# Patient Record
Sex: Female | Born: 1960 | Race: White | Hispanic: No | Marital: Married | Smoking: Never smoker
Health system: Southern US, Community
[De-identification: ages and names within clinical notes are randomized; demographics above are authoritative.]

## PROBLEM LIST (undated history)

## (undated) DIAGNOSIS — K589 Irritable bowel syndrome without diarrhea: Secondary | ICD-10-CM

## (undated) DIAGNOSIS — C539 Malignant neoplasm of cervix uteri, unspecified: Secondary | ICD-10-CM

## (undated) DIAGNOSIS — F32A Depression, unspecified: Secondary | ICD-10-CM

## (undated) DIAGNOSIS — F419 Anxiety disorder, unspecified: Secondary | ICD-10-CM

## (undated) DIAGNOSIS — G43909 Migraine, unspecified, not intractable, without status migrainosus: Secondary | ICD-10-CM

## (undated) DIAGNOSIS — R011 Cardiac murmur, unspecified: Secondary | ICD-10-CM

## (undated) HISTORY — DX: Anxiety disorder, unspecified: F41.9

## (undated) HISTORY — DX: Migraine, unspecified, not intractable, without status migrainosus: G43.909

## (undated) HISTORY — DX: Depression, unspecified: F32.A

## (undated) HISTORY — DX: Irritable bowel syndrome without diarrhea: K58.9

## (undated) HISTORY — DX: Cardiac murmur, unspecified: R01.1

## (undated) HISTORY — PX: ABDOMINAL HYSTERECTOMY: SUR658

## (undated) HISTORY — DX: Malignant neoplasm of cervix uteri, unspecified: C53.9

---

## 1996-10-20 HISTORY — PX: HAND TENDON SURGERY: SHX663

## 2006-04-02 ENCOUNTER — Ambulatory Visit (HOSPITAL_COMMUNITY): Admission: RE | Admit: 2006-04-02 | Discharge: 2006-04-02 | Payer: Self-pay | Admitting: Obstetrics and Gynecology

## 2006-07-29 ENCOUNTER — Emergency Department (HOSPITAL_COMMUNITY): Admission: EM | Admit: 2006-07-29 | Discharge: 2006-07-29 | Payer: Self-pay | Admitting: Emergency Medicine

## 2006-08-13 ENCOUNTER — Encounter (HOSPITAL_COMMUNITY): Admission: RE | Admit: 2006-08-13 | Discharge: 2006-09-12 | Payer: Self-pay | Admitting: Occupational Medicine

## 2006-09-14 ENCOUNTER — Encounter (HOSPITAL_COMMUNITY): Admission: RE | Admit: 2006-09-14 | Discharge: 2006-10-14 | Payer: Self-pay | Admitting: Occupational Medicine

## 2006-10-05 ENCOUNTER — Emergency Department (HOSPITAL_COMMUNITY): Admission: EM | Admit: 2006-10-05 | Discharge: 2006-10-06 | Payer: Self-pay | Admitting: Emergency Medicine

## 2008-03-17 ENCOUNTER — Ambulatory Visit (HOSPITAL_COMMUNITY): Admission: RE | Admit: 2008-03-17 | Discharge: 2008-03-17 | Payer: Self-pay | Admitting: Obstetrics and Gynecology

## 2008-04-03 ENCOUNTER — Encounter: Admission: RE | Admit: 2008-04-03 | Discharge: 2008-04-03 | Payer: Self-pay | Admitting: Obstetrics and Gynecology

## 2010-01-25 ENCOUNTER — Encounter: Admission: RE | Admit: 2010-01-25 | Discharge: 2010-01-25 | Payer: Self-pay | Admitting: Obstetrics and Gynecology

## 2010-11-10 ENCOUNTER — Encounter: Payer: Self-pay | Admitting: Obstetrics and Gynecology

## 2010-12-20 ENCOUNTER — Ambulatory Visit (INDEPENDENT_AMBULATORY_CARE_PROVIDER_SITE_OTHER): Payer: 59

## 2010-12-20 ENCOUNTER — Inpatient Hospital Stay (INDEPENDENT_AMBULATORY_CARE_PROVIDER_SITE_OTHER)
Admission: RE | Admit: 2010-12-20 | Discharge: 2010-12-20 | Disposition: A | Discharge status: Home / Self Care | Payer: 59 | Source: Ambulatory Visit | Attending: Family Medicine | Admitting: Family Medicine

## 2010-12-20 DIAGNOSIS — M549 Dorsalgia, unspecified: Secondary | ICD-10-CM

## 2010-12-20 DIAGNOSIS — K589 Irritable bowel syndrome without diarrhea: Secondary | ICD-10-CM

## 2012-03-01 ENCOUNTER — Encounter: Payer: Self-pay | Admitting: Gastroenterology

## 2012-03-18 ENCOUNTER — Ambulatory Visit (INDEPENDENT_AMBULATORY_CARE_PROVIDER_SITE_OTHER): Payer: 59 | Admitting: Gastroenterology

## 2012-03-18 ENCOUNTER — Encounter: Payer: Self-pay | Admitting: Gastroenterology

## 2012-03-18 VITALS — BP 100/60 | HR 64 | Ht 63.5 in | Wt 136.6 lb

## 2012-03-18 DIAGNOSIS — R143 Flatulence: Secondary | ICD-10-CM

## 2012-03-18 DIAGNOSIS — R140 Abdominal distension (gaseous): Secondary | ICD-10-CM

## 2012-03-18 DIAGNOSIS — K5900 Constipation, unspecified: Secondary | ICD-10-CM

## 2012-03-18 DIAGNOSIS — K589 Irritable bowel syndrome without diarrhea: Secondary | ICD-10-CM

## 2012-03-18 MED ORDER — POLYETHYLENE GLYCOL 3350 17 GM/SCOOP PO POWD: 17.0000 g | Freq: Every day | ORAL | Status: AC

## 2012-03-18 MED ORDER — HYOSCYAMINE SULFATE 0.125 MG PO TABS: ORAL_TABLET | ORAL | Status: DC

## 2012-03-18 NOTE — Patient Instructions (Addendum)
Increase taking your hyoscyamine to 1-2 tablets by mouth every 4 hours as needed. A prescription has been sent to your pharmacy.  Take Miralax mixing 17 grams in 8 oz of water every day for constipation. cc:  Arlina Robes, MD

## 2012-03-18 NOTE — Progress Notes (Signed)
History of Present Illness: This is a 51 year old female with a long history of irritable bowel syndrome. She has constipation with abdominal bloating and left lower quadrant pain. Occasionally she has diarrhea. She states she underwent colonoscopy in [place] in 2002 and she reports that examination was negative. She notes that stress tends to make her pain and constipation much worse. She has recently been increasing her dietary fiber and her symptoms have improved. She has a bowel movement about every 1 to 3 days. Denies weight loss, diarrhea, change in stool caliber, melena, hematochezia, nausea, vomiting, dysphagia, reflux symptoms, chest pain.  Allergies  Allergen Reactions  . Augmentin (Amoxicillin-Pot Clavulanate) Diarrhea and Nausea And Vomiting  . Codeine Diarrhea and Nausea And Vomiting  . Erythromycin Diarrhea and Nausea And Vomiting   Outpatient Prescriptions Prior to Visit  Medication Sig Dispense Refill  . hyoscyamine (LEVSIN, ANASPAZ) 0.125 MG tablet Take 0.125 mg by mouth 3 (three) times daily as needed.      . Phentermine HCl 37.5 MG TBDP Take 1 tablet by mouth daily.      . verapamil (VERELAN PM) 240 MG 24 hr capsule Take 240 mg by mouth at bedtime.       Past Medical History  Diagnosis Date  . IBS (irritable bowel syndrome)   . Migraines   . Anxiety   . Cervical cancer 003   Past Surgical History  Procedure Date  . Abdominal hysterectomy   . Cesarean section    History   Social History  . Marital Status: Married    Spouse Name: N/A    Number of Children: 1  . Years of Education: N/A   Occupational History  . RN [employer]   Social History Main Topics  . Smoking status: Never Smoker   . Smokeless tobacco: Never Used  . Alcohol Use: No  . Drug Use: No  . Sexually Active: None   Other Topics Concern  . None   Social History Narrative  . None   Family History  Problem Relation Age of Onset  . Prostate cancer Father   . Colon polyps Father    . Diabetes Maternal Grandfather   . Breast cancer Paternal Grandmother   . Colon cancer Paternal Grandmother   . Heart disease Paternal Grandfather   . Hypertension Mother     Review of Systems: Pertinent positive and negative review of systems were noted in the above HPI section. All other review of systems were otherwise negative.  Physical Exam: General: Well developed , well nourished, no acute distress Head: Normocephalic and atraumatic Eyes:  sclerae anicteric, EOMI Ears: Normal auditory acuity Mouth: No deformity or lesions Neck: Supple, no masses or thyromegaly Lungs: Clear throughout to auscultation Heart: Regular rate and rhythm; no murmurs, rubs or bruits Abdomen: Soft, non tender and non distended. No masses, hepatosplenomegaly or hernias noted. Normal Bowel sounds Rectal: Deferred to colonoscopy Musculoskeletal: Symmetrical with no gross deformities  Skin: No lesions on visible extremities Pulses:  Normal pulses noted Extremities: No clubbing, cyanosis, edema or deformities noted Neurological: Alert oriented x 4, grossly nonfocal Cervical Nodes:  No significant cervical adenopathy Inguinal Nodes: No significant inguinal adenopathy Psychological:  Alert and cooperative. Normal mood and affect  Assessment and Recommendations:  1. Left lower quadrant pain, constipation, abdominal bloating are all likely secondary to chronic irritable bowel syndrome. Continue a high fiber diet and daily probiotics. Begin MiraLax once daily. Increase hyoscyamine to 1-2 every 4 hours as needed. I recommended proceeding with colonoscopy  to rule out colorectal lesions and for colorectal cancer screening. The risks, benefits, and alternatives to colonoscopy with possible biopsy and possible polypectomy were discussed with the patient and they consent to proceed but she would like to temporarily postpone her exam until she institutes all  above recommendations.

## 2012-05-05 ENCOUNTER — Encounter: Payer: Self-pay | Admitting: Gastroenterology

## 2012-05-05 ENCOUNTER — Ambulatory Visit (INDEPENDENT_AMBULATORY_CARE_PROVIDER_SITE_OTHER): Payer: 59 | Admitting: Gastroenterology

## 2012-05-05 VITALS — BP 100/72 | HR 64 | Ht 63.5 in | Wt 136.6 lb

## 2012-05-05 DIAGNOSIS — K589 Irritable bowel syndrome without diarrhea: Secondary | ICD-10-CM

## 2012-05-05 DIAGNOSIS — K5900 Constipation, unspecified: Secondary | ICD-10-CM

## 2012-05-05 MED ORDER — MOVIPREP 100 G PO SOLR: 1.0000 | Freq: Once | ORAL | Status: DC

## 2012-05-05 NOTE — Patient Instructions (Addendum)
You have been scheduled for a colonoscopy with propofol. Please follow written instructions given to you at your visit today.  Please pick up your prep kit at the pharmacy within the next 1-3 days. If you use inhalers (even only as needed), please bring them with you on the day of your procedure. Take Gas-X four times a day as needed for gas, bloating. cc: Ardean Larsen, MD

## 2012-05-05 NOTE — Progress Notes (Signed)
History of Present Illness: This is a 51 year old female with ongoing constipation, occasional diarrhea, left lower quadrant abdominal pain and bloating. She states she has just started MiraLax and has had noted slightly looser stools and less constipation but called her symptoms persist. She is ready to proceed with colonoscopy.  Current Medications, Allergies, Past Medical History, Past Surgical History, Family History and Social History were reviewed in [employer] record.  Physical Exam: General: Well developed , well nourished, no acute distress Head: Normocephalic and atraumatic Eyes:  sclerae anicteric, EOMI Ears: Normal auditory acuity Mouth: No deformity or lesions Lungs: Clear throughout to auscultation Heart: Regular rate and rhythm; no murmurs, rubs or bruits Abdomen: Soft, mild, generalized tenderness to deep palpation without rebound or guarding and non distended. No masses, hepatosplenomegaly or hernias noted. Normal Bowel sounds Musculoskeletal: Symmetrical with no gross deformities  Pulses:  Normal pulses noted Extremities: No clubbing, cyanosis, edema or deformities noted Neurological: Alert oriented x 4, grossly nonfocal Psychological:  Alert and cooperative. Normal mood and affect  Assessment and Recommendations:  1. Suspected chronic irritable bowel syndrome. Rule out colorectal neoplasms and other disorders. Continue a high fiber diet and daily probiotics. MiraLax qod to bid titrated to need. Continue hyoscyamine to 1-2 every 4 hours as needed. Begin Gas-X 4 times a day when necessary. I recommended proceeding with colonoscopy to rule out colorectal lesions and for colorectal cancer screening. The risks, benefits, and alternatives to colonoscopy with possible biopsy and possible polypectomy were discussed with the patient and they consent to proceed.

## 2012-06-15 ENCOUNTER — Encounter: Payer: Self-pay | Admitting: Gastroenterology

## 2012-06-15 ENCOUNTER — Ambulatory Visit (AMBULATORY_SURGERY_CENTER): Payer: 59 | Admitting: Gastroenterology

## 2012-06-15 VITALS — BP 115/63 | HR 67 | Temp 98.2°F | Resp 12 | Ht 63.0 in | Wt 136.0 lb

## 2012-06-15 DIAGNOSIS — K589 Irritable bowel syndrome without diarrhea: Secondary | ICD-10-CM

## 2012-06-15 DIAGNOSIS — Z1211 Encounter for screening for malignant neoplasm of colon: Secondary | ICD-10-CM

## 2012-06-15 DIAGNOSIS — K5900 Constipation, unspecified: Secondary | ICD-10-CM

## 2012-06-15 MED ORDER — SODIUM CHLORIDE 0.9 % IV SOLN: 500.0000 mL | INTRAVENOUS | Status: DC

## 2012-06-15 NOTE — Progress Notes (Signed)
Patient did not experience any of the following events: a burn prior to discharge; a fall within the facility; wrong site/side/patient/procedure/implant event; or a hospital transfer or hospital admission upon discharge from the facility. (G8907) Patient did not have preoperative order for IV antibiotic SSI prophylaxis. (G8918)  

## 2012-06-15 NOTE — Progress Notes (Signed)
PATIENT TO BATHROOM PRIOR TO DISCHARGE. 

## 2012-06-15 NOTE — Op Note (Signed)
[facility] [address] [place], [address]   COLONOSCOPY PROCEDURE REPORT  PATIENT: Brittany Stewart, Brittany Stewart  MR#: [id number] BIRTHDATE: April 25, 1961 , 51  yrs. old GENDER: Female ENDOSCOPIST: Meryl Dare, MD, [facility]  PROCEDURE DATE:  06/15/2012 PROCEDURE:   Colonoscopy, screening ASA CLASS:   Class II INDICATIONS:average risk patient for colorectal cancer MEDICATIONS: MAC sedation, administered by CRNA and propofol (Diprivan) 300mg  IV DESCRIPTION OF PROCEDURE:   After the risks benefits and alternatives of the procedure were thoroughly explained, informed consent was obtained.  A digital rectal exam revealed no abnormalities of the rectum.   The LB [id number]  endoscope was introduced through the anus and advanced to the cecum, which was identified by both the appendix and ileocecal valve. No adverse events experienced.   She had a tortuous and redundant colon.   The quality of the prep was good, using MoviPrep  The instrument was then slowly withdrawn as the colon was fully examined.   COLON FINDINGS: A normal appearing cecum, ileocecal valve, and appendiceal orifice were identified.  The ascending, hepatic flexure, transverse, splenic flexure, descending, sigmoid colon and rectum appeared unremarkable.  No polyps or cancers were seen. Retroflexed views revealed small internal hemorrhoids. The time to cecum=2 minutes 15 seconds.  Withdrawal time=13 minutes 30 seconds. The scope was withdrawn and the procedure completed.  COMPLICATIONS: There were no complications.  ENDOSCOPIC IMPRESSION: 1. Normal colon  RECOMMENDATIONS: 1. Continue current colorectal screening recommendations for "routine risk" patients with a repeat colonoscopy in 10 years.   eSigned:  Meryl Dare, MD, [facility] 06/15/2012 2:59 PM   cc: Arlina Robes, MD

## 2012-06-15 NOTE — Patient Instructions (Signed)
YOU HAD AN ENDOSCOPIC PROCEDURE TODAY AT THE [facility]: Refer to the procedure report that was given to you for any specific questions about what was found during the examination.  If the procedure report does not answer your questions, please call your gastroenterologist to clarify.  If you requested that your care partner not be given the details of your procedure findings, then the procedure report has been included in a sealed envelope for you to review at your convenience later.  YOU SHOULD EXPECT: Some feelings of bloating in the abdomen. Passage of more gas than usual.  Walking can help get rid of the air that was put into your GI tract during the procedure and reduce the bloating. If you had a lower endoscopy (such as a colonoscopy or flexible sigmoidoscopy) you may notice spotting of blood in your stool or on the toilet paper. If you underwent a bowel prep for your procedure, then you may not have a normal bowel movement for a few days.  DIET: Your first meal following the procedure should be a light meal and then it is ok to progress to your normal diet.  A half-sandwich or bowl of soup is an example of a good first meal.  Heavy or fried foods are harder to digest and may make you feel nauseous or bloated.  Likewise meals heavy in dairy and vegetables can cause extra gas to form and this can also increase the bloating.  Drink plenty of fluids but you should avoid alcoholic beverages for 24 hours.  ACTIVITY: Your care partner should take you home directly after the procedure.  You should plan to take it easy, moving slowly for the rest of the day.  You can resume normal activity the day after the procedure however you should NOT DRIVE or use heavy machinery for 24 hours (because of the sedation medicines used during the test).    SYMPTOMS TO REPORT IMMEDIATELY: A gastroenterologist can be reached at any hour.  During normal business hours, 8:30 AM to 5:00 PM Monday through Friday,  call [phone].  After hours and on weekends, please call the GI answering service at [phone] who will take a message and have the physician on call contact you.   Following lower endoscopy (colonoscopy or flexible sigmoidoscopy):  Excessive amounts of blood in the stool  Significant tenderness or worsening of abdominal pains  Swelling of the abdomen that is new, acute  Fever of 100F or higher    FOLLOW UP: If any biopsies were taken you will be contacted by phone or by letter within the next 1-3 weeks.  Call your gastroenterologist if you have not heard about the biopsies in 3 weeks.  Our staff will call the home number listed on your records the next business day following your procedure to check on you and address any questions or concerns that you may have at that time regarding the information given to you following your procedure. This is a courtesy call and so if there is no answer at the home number and we have not heard from you through the emergency physician on call, we will assume that you have returned to your regular daily activities without incident.  SIGNATURES/CONFIDENTIALITY: You and/or your care partner have signed paperwork which will be entered into your electronic medical record.  These signatures attest to the fact that that the information above on your After Visit Summary has been reviewed and is understood.  Full responsibility of the confidentiality   of this discharge information lies with you and/or your care-partner.     

## 2012-06-16 ENCOUNTER — Telehealth: Payer: Self-pay | Admitting: *Deleted

## 2012-06-16 NOTE — Telephone Encounter (Signed)
Telephone number provided reached a voice mail for a Brittany Stewart, no message left.

## 2013-04-06 ENCOUNTER — Other Ambulatory Visit: Payer: Self-pay

## 2013-04-06 DIAGNOSIS — Z1231 Encounter for screening mammogram for malignant neoplasm of breast: Secondary | ICD-10-CM

## 2013-04-18 ENCOUNTER — Ambulatory Visit
Admission: RE | Admit: 2013-04-18 | Discharge: 2013-04-18 | Disposition: A | Discharge status: Home / Self Care | Payer: 59 | Source: Ambulatory Visit

## 2013-04-18 DIAGNOSIS — Z1231 Encounter for screening mammogram for malignant neoplasm of breast: Secondary | ICD-10-CM

## 2014-03-07 ENCOUNTER — Other Ambulatory Visit (HOSPITAL_COMMUNITY): Payer: Self-pay | Admitting: Family Medicine

## 2014-03-07 DIAGNOSIS — M545 Low back pain, unspecified: Secondary | ICD-10-CM

## 2014-03-07 DIAGNOSIS — M5410 Radiculopathy, site unspecified: Secondary | ICD-10-CM

## 2014-03-09 ENCOUNTER — Ambulatory Visit (HOSPITAL_COMMUNITY)
Admission: RE | Admit: 2014-03-09 | Discharge: 2014-03-09 | Disposition: A | Discharge status: Home / Self Care | Payer: 59 | Source: Ambulatory Visit | Attending: Family Medicine | Admitting: Family Medicine

## 2014-03-09 DIAGNOSIS — M5126 Other intervertebral disc displacement, lumbar region: Secondary | ICD-10-CM | POA: Diagnosis not present

## 2014-03-09 DIAGNOSIS — M545 Low back pain, unspecified: Secondary | ICD-10-CM | POA: Insufficient documentation

## 2014-03-09 DIAGNOSIS — M5410 Radiculopathy, site unspecified: Secondary | ICD-10-CM

## 2014-03-16 ENCOUNTER — Other Ambulatory Visit: Payer: Self-pay | Admitting: Neurological Surgery

## 2014-03-16 DIAGNOSIS — M5416 Radiculopathy, lumbar region: Secondary | ICD-10-CM

## 2014-03-24 ENCOUNTER — Ambulatory Visit
Admission: RE | Admit: 2014-03-24 | Discharge: 2014-03-24 | Disposition: A | Discharge status: Home / Self Care | Payer: 59 | Source: Ambulatory Visit | Attending: Neurological Surgery | Admitting: Neurological Surgery

## 2014-03-24 DIAGNOSIS — M5416 Radiculopathy, lumbar region: Secondary | ICD-10-CM

## 2014-03-24 MED ORDER — IOHEXOL 180 MG/ML  SOLN
1.0000 mL | Freq: Once | INTRAMUSCULAR | Status: AC | PRN
  Administered 2014-03-24: 1 mL via EPIDURAL

## 2014-03-24 MED ORDER — METHYLPREDNISOLONE ACETATE 40 MG/ML INJ SUSP (RADIOLOG
120.0000 mg | Freq: Once | INTRAMUSCULAR | Status: AC
  Administered 2014-03-24: 120 mg via EPIDURAL

## 2014-03-24 NOTE — Discharge Instructions (Signed)

## 2014-04-04 ENCOUNTER — Other Ambulatory Visit: Payer: Self-pay | Admitting: Neurological Surgery

## 2014-04-04 DIAGNOSIS — M545 Low back pain, unspecified: Secondary | ICD-10-CM

## 2014-04-04 DIAGNOSIS — M5430 Sciatica, unspecified side: Secondary | ICD-10-CM

## 2014-04-07 ENCOUNTER — Ambulatory Visit
Admission: RE | Admit: 2014-04-07 | Discharge: 2014-04-07 | Disposition: A | Discharge status: Home / Self Care | Payer: 59 | Source: Ambulatory Visit | Attending: Neurological Surgery | Admitting: Neurological Surgery

## 2014-04-07 DIAGNOSIS — M545 Low back pain, unspecified: Secondary | ICD-10-CM

## 2014-04-07 DIAGNOSIS — M5430 Sciatica, unspecified side: Secondary | ICD-10-CM

## 2014-04-07 MED ORDER — METHYLPREDNISOLONE ACETATE 40 MG/ML INJ SUSP (RADIOLOG
120.0000 mg | Freq: Once | INTRAMUSCULAR | Status: AC
  Administered 2014-04-07: 120 mg via EPIDURAL

## 2014-04-07 MED ORDER — IOHEXOL 180 MG/ML  SOLN
1.0000 mL | Freq: Once | INTRAMUSCULAR | Status: AC | PRN
  Administered 2014-04-07: 1 mL via EPIDURAL

## 2014-04-07 NOTE — Discharge Instructions (Signed)

## 2015-04-18 ENCOUNTER — Other Ambulatory Visit (HOSPITAL_COMMUNITY): Payer: Self-pay | Admitting: Obstetrics and Gynecology

## 2015-04-18 DIAGNOSIS — Z1231 Encounter for screening mammogram for malignant neoplasm of breast: Secondary | ICD-10-CM

## 2015-04-20 ENCOUNTER — Ambulatory Visit (HOSPITAL_COMMUNITY)
Admission: RE | Admit: 2015-04-20 | Discharge: 2015-04-20 | Disposition: A | Discharge status: Home / Self Care | Payer: 59 | Source: Ambulatory Visit | Attending: Obstetrics and Gynecology | Admitting: Obstetrics and Gynecology

## 2015-04-20 DIAGNOSIS — Z1231 Encounter for screening mammogram for malignant neoplasm of breast: Secondary | ICD-10-CM

## 2015-09-10 ENCOUNTER — Other Ambulatory Visit (HOSPITAL_COMMUNITY): Payer: Self-pay | Admitting: Chiropractic Medicine

## 2015-09-10 ENCOUNTER — Other Ambulatory Visit: Payer: Self-pay | Admitting: Chiropractic Medicine

## 2015-09-10 DIAGNOSIS — M5117 Intervertebral disc disorders with radiculopathy, lumbosacral region: Secondary | ICD-10-CM

## 2015-09-11 ENCOUNTER — Ambulatory Visit (HOSPITAL_COMMUNITY)
Admission: RE | Admit: 2015-09-11 | Discharge: 2015-09-11 | Disposition: A | Discharge status: Home / Self Care | Payer: 59 | Source: Ambulatory Visit | Attending: Chiropractic Medicine | Admitting: Chiropractic Medicine

## 2015-09-11 DIAGNOSIS — M79605 Pain in left leg: Secondary | ICD-10-CM | POA: Insufficient documentation

## 2015-09-11 DIAGNOSIS — M5126 Other intervertebral disc displacement, lumbar region: Secondary | ICD-10-CM | POA: Diagnosis not present

## 2015-09-11 DIAGNOSIS — M5117 Intervertebral disc disorders with radiculopathy, lumbosacral region: Secondary | ICD-10-CM

## 2015-09-11 DIAGNOSIS — R200 Anesthesia of skin: Secondary | ICD-10-CM | POA: Insufficient documentation

## 2015-10-23 DIAGNOSIS — M9903 Segmental and somatic dysfunction of lumbar region: Secondary | ICD-10-CM | POA: Diagnosis not present

## 2015-10-23 DIAGNOSIS — M9904 Segmental and somatic dysfunction of sacral region: Secondary | ICD-10-CM | POA: Diagnosis not present

## 2015-10-23 DIAGNOSIS — M5117 Intervertebral disc disorders with radiculopathy, lumbosacral region: Secondary | ICD-10-CM | POA: Diagnosis not present

## 2015-10-23 DIAGNOSIS — M9905 Segmental and somatic dysfunction of pelvic region: Secondary | ICD-10-CM | POA: Diagnosis not present

## 2015-10-24 DIAGNOSIS — M9903 Segmental and somatic dysfunction of lumbar region: Secondary | ICD-10-CM | POA: Diagnosis not present

## 2015-10-24 DIAGNOSIS — M9904 Segmental and somatic dysfunction of sacral region: Secondary | ICD-10-CM | POA: Diagnosis not present

## 2015-10-24 DIAGNOSIS — M9905 Segmental and somatic dysfunction of pelvic region: Secondary | ICD-10-CM | POA: Diagnosis not present

## 2015-10-24 DIAGNOSIS — M5117 Intervertebral disc disorders with radiculopathy, lumbosacral region: Secondary | ICD-10-CM | POA: Diagnosis not present

## 2015-10-25 ENCOUNTER — Other Ambulatory Visit: Payer: Self-pay | Admitting: Chiropractic Medicine

## 2015-10-25 DIAGNOSIS — M5117 Intervertebral disc disorders with radiculopathy, lumbosacral region: Secondary | ICD-10-CM | POA: Diagnosis not present

## 2015-10-25 DIAGNOSIS — M9904 Segmental and somatic dysfunction of sacral region: Secondary | ICD-10-CM | POA: Diagnosis not present

## 2015-10-25 DIAGNOSIS — R200 Anesthesia of skin: Secondary | ICD-10-CM

## 2015-10-25 DIAGNOSIS — M545 Low back pain: Principal | ICD-10-CM

## 2015-10-25 DIAGNOSIS — M9905 Segmental and somatic dysfunction of pelvic region: Secondary | ICD-10-CM | POA: Diagnosis not present

## 2015-10-25 DIAGNOSIS — G8929 Other chronic pain: Secondary | ICD-10-CM

## 2015-10-25 DIAGNOSIS — M9903 Segmental and somatic dysfunction of lumbar region: Secondary | ICD-10-CM | POA: Diagnosis not present

## 2015-10-29 DIAGNOSIS — M9903 Segmental and somatic dysfunction of lumbar region: Secondary | ICD-10-CM | POA: Diagnosis not present

## 2015-10-29 DIAGNOSIS — M9904 Segmental and somatic dysfunction of sacral region: Secondary | ICD-10-CM | POA: Diagnosis not present

## 2015-10-29 DIAGNOSIS — M5117 Intervertebral disc disorders with radiculopathy, lumbosacral region: Secondary | ICD-10-CM | POA: Diagnosis not present

## 2015-10-29 DIAGNOSIS — M9905 Segmental and somatic dysfunction of pelvic region: Secondary | ICD-10-CM | POA: Diagnosis not present

## 2015-10-31 DIAGNOSIS — M9903 Segmental and somatic dysfunction of lumbar region: Secondary | ICD-10-CM | POA: Diagnosis not present

## 2015-10-31 DIAGNOSIS — M5117 Intervertebral disc disorders with radiculopathy, lumbosacral region: Secondary | ICD-10-CM | POA: Diagnosis not present

## 2015-10-31 DIAGNOSIS — M9905 Segmental and somatic dysfunction of pelvic region: Secondary | ICD-10-CM | POA: Diagnosis not present

## 2015-10-31 DIAGNOSIS — M9904 Segmental and somatic dysfunction of sacral region: Secondary | ICD-10-CM | POA: Diagnosis not present

## 2015-11-01 ENCOUNTER — Ambulatory Visit
Admission: RE | Admit: 2015-11-01 | Discharge: 2015-11-01 | Disposition: A | Discharge status: Home / Self Care | Payer: 59 | Source: Ambulatory Visit | Attending: Chiropractic Medicine | Admitting: Chiropractic Medicine

## 2015-11-01 DIAGNOSIS — G8929 Other chronic pain: Secondary | ICD-10-CM

## 2015-11-01 DIAGNOSIS — M47817 Spondylosis without myelopathy or radiculopathy, lumbosacral region: Secondary | ICD-10-CM | POA: Diagnosis not present

## 2015-11-01 DIAGNOSIS — M545 Low back pain: Principal | ICD-10-CM

## 2015-11-01 MED ORDER — IOHEXOL 180 MG/ML  SOLN
1.0000 mL | Freq: Once | INTRAMUSCULAR | Status: AC | PRN
Start: 2015-11-01 — End: 2015-11-01
  Administered 2015-11-01: 1 mL via EPIDURAL

## 2015-11-01 MED ORDER — METHYLPREDNISOLONE ACETATE 40 MG/ML INJ SUSP (RADIOLOG
120.0000 mg | Freq: Once | INTRAMUSCULAR | Status: AC
  Administered 2015-11-01: 120 mg via EPIDURAL

## 2015-11-01 NOTE — Discharge Instructions (Signed)

## 2015-11-05 DIAGNOSIS — M9905 Segmental and somatic dysfunction of pelvic region: Secondary | ICD-10-CM | POA: Diagnosis not present

## 2015-11-05 DIAGNOSIS — M9903 Segmental and somatic dysfunction of lumbar region: Secondary | ICD-10-CM | POA: Diagnosis not present

## 2015-11-05 DIAGNOSIS — M5117 Intervertebral disc disorders with radiculopathy, lumbosacral region: Secondary | ICD-10-CM | POA: Diagnosis not present

## 2015-11-05 DIAGNOSIS — M9904 Segmental and somatic dysfunction of sacral region: Secondary | ICD-10-CM | POA: Diagnosis not present

## 2015-11-07 DIAGNOSIS — M9903 Segmental and somatic dysfunction of lumbar region: Secondary | ICD-10-CM | POA: Diagnosis not present

## 2015-11-07 DIAGNOSIS — M9904 Segmental and somatic dysfunction of sacral region: Secondary | ICD-10-CM | POA: Diagnosis not present

## 2015-11-07 DIAGNOSIS — M9905 Segmental and somatic dysfunction of pelvic region: Secondary | ICD-10-CM | POA: Diagnosis not present

## 2015-11-07 DIAGNOSIS — M5117 Intervertebral disc disorders with radiculopathy, lumbosacral region: Secondary | ICD-10-CM | POA: Diagnosis not present

## 2015-11-08 ENCOUNTER — Other Ambulatory Visit: Payer: Self-pay | Admitting: Chiropractic Medicine

## 2015-11-08 DIAGNOSIS — M5117 Intervertebral disc disorders with radiculopathy, lumbosacral region: Secondary | ICD-10-CM | POA: Diagnosis not present

## 2015-11-08 DIAGNOSIS — G8929 Other chronic pain: Secondary | ICD-10-CM

## 2015-11-08 DIAGNOSIS — M9903 Segmental and somatic dysfunction of lumbar region: Secondary | ICD-10-CM | POA: Diagnosis not present

## 2015-11-08 DIAGNOSIS — M545 Low back pain, unspecified: Secondary | ICD-10-CM

## 2015-11-08 DIAGNOSIS — M9904 Segmental and somatic dysfunction of sacral region: Secondary | ICD-10-CM | POA: Diagnosis not present

## 2015-11-08 DIAGNOSIS — M9905 Segmental and somatic dysfunction of pelvic region: Secondary | ICD-10-CM | POA: Diagnosis not present

## 2015-11-12 DIAGNOSIS — M9903 Segmental and somatic dysfunction of lumbar region: Secondary | ICD-10-CM | POA: Diagnosis not present

## 2015-11-12 DIAGNOSIS — M5117 Intervertebral disc disorders with radiculopathy, lumbosacral region: Secondary | ICD-10-CM | POA: Diagnosis not present

## 2015-11-12 DIAGNOSIS — M9904 Segmental and somatic dysfunction of sacral region: Secondary | ICD-10-CM | POA: Diagnosis not present

## 2015-11-12 DIAGNOSIS — M9905 Segmental and somatic dysfunction of pelvic region: Secondary | ICD-10-CM | POA: Diagnosis not present

## 2015-11-14 DIAGNOSIS — M9903 Segmental and somatic dysfunction of lumbar region: Secondary | ICD-10-CM | POA: Diagnosis not present

## 2015-11-14 DIAGNOSIS — M5117 Intervertebral disc disorders with radiculopathy, lumbosacral region: Secondary | ICD-10-CM | POA: Diagnosis not present

## 2015-11-14 DIAGNOSIS — M9905 Segmental and somatic dysfunction of pelvic region: Secondary | ICD-10-CM | POA: Diagnosis not present

## 2015-11-14 DIAGNOSIS — M9904 Segmental and somatic dysfunction of sacral region: Secondary | ICD-10-CM | POA: Diagnosis not present

## 2015-11-15 ENCOUNTER — Ambulatory Visit
Admission: RE | Admit: 2015-11-15 | Discharge: 2015-11-15 | Disposition: A | Discharge status: Home / Self Care | Payer: 59 | Source: Ambulatory Visit | Attending: Chiropractic Medicine | Admitting: Chiropractic Medicine

## 2015-11-15 DIAGNOSIS — M545 Low back pain: Principal | ICD-10-CM

## 2015-11-15 DIAGNOSIS — M47817 Spondylosis without myelopathy or radiculopathy, lumbosacral region: Secondary | ICD-10-CM | POA: Diagnosis not present

## 2015-11-15 DIAGNOSIS — G8929 Other chronic pain: Secondary | ICD-10-CM

## 2015-11-15 MED ORDER — METHYLPREDNISOLONE ACETATE 40 MG/ML INJ SUSP (RADIOLOG
120.0000 mg | Freq: Once | INTRAMUSCULAR | Status: AC
  Administered 2015-11-15: 120 mg via EPIDURAL

## 2015-11-15 MED ORDER — IOHEXOL 180 MG/ML  SOLN
1.0000 mL | Freq: Once | INTRAMUSCULAR | Status: AC | PRN
  Administered 2015-11-15: 1 mL via EPIDURAL

## 2015-11-19 DIAGNOSIS — M9903 Segmental and somatic dysfunction of lumbar region: Secondary | ICD-10-CM | POA: Diagnosis not present

## 2015-11-19 DIAGNOSIS — M9904 Segmental and somatic dysfunction of sacral region: Secondary | ICD-10-CM | POA: Diagnosis not present

## 2015-11-19 DIAGNOSIS — M5136 Other intervertebral disc degeneration, lumbar region: Secondary | ICD-10-CM | POA: Diagnosis not present

## 2015-11-19 DIAGNOSIS — M5117 Intervertebral disc disorders with radiculopathy, lumbosacral region: Secondary | ICD-10-CM | POA: Diagnosis not present

## 2015-11-19 DIAGNOSIS — M9905 Segmental and somatic dysfunction of pelvic region: Secondary | ICD-10-CM | POA: Diagnosis not present

## 2015-11-21 DIAGNOSIS — M9904 Segmental and somatic dysfunction of sacral region: Secondary | ICD-10-CM | POA: Diagnosis not present

## 2015-11-21 DIAGNOSIS — M9903 Segmental and somatic dysfunction of lumbar region: Secondary | ICD-10-CM | POA: Diagnosis not present

## 2015-11-21 DIAGNOSIS — M9905 Segmental and somatic dysfunction of pelvic region: Secondary | ICD-10-CM | POA: Diagnosis not present

## 2015-11-21 DIAGNOSIS — M5117 Intervertebral disc disorders with radiculopathy, lumbosacral region: Secondary | ICD-10-CM | POA: Diagnosis not present

## 2015-11-22 DIAGNOSIS — M5489 Other dorsalgia: Secondary | ICD-10-CM | POA: Diagnosis not present

## 2015-11-22 DIAGNOSIS — M545 Low back pain: Secondary | ICD-10-CM | POA: Diagnosis not present

## 2015-11-22 DIAGNOSIS — M546 Pain in thoracic spine: Secondary | ICD-10-CM | POA: Diagnosis not present

## 2015-11-23 DIAGNOSIS — M546 Pain in thoracic spine: Secondary | ICD-10-CM | POA: Diagnosis not present

## 2015-11-23 DIAGNOSIS — M5489 Other dorsalgia: Secondary | ICD-10-CM | POA: Diagnosis not present

## 2015-11-23 DIAGNOSIS — M545 Low back pain: Secondary | ICD-10-CM | POA: Diagnosis not present

## 2015-11-26 DIAGNOSIS — M545 Low back pain: Secondary | ICD-10-CM | POA: Diagnosis not present

## 2015-11-26 DIAGNOSIS — M9905 Segmental and somatic dysfunction of pelvic region: Secondary | ICD-10-CM | POA: Diagnosis not present

## 2015-11-26 DIAGNOSIS — M5117 Intervertebral disc disorders with radiculopathy, lumbosacral region: Secondary | ICD-10-CM | POA: Diagnosis not present

## 2015-11-26 DIAGNOSIS — M9904 Segmental and somatic dysfunction of sacral region: Secondary | ICD-10-CM | POA: Diagnosis not present

## 2015-11-26 DIAGNOSIS — M5489 Other dorsalgia: Secondary | ICD-10-CM | POA: Diagnosis not present

## 2015-11-26 DIAGNOSIS — M546 Pain in thoracic spine: Secondary | ICD-10-CM | POA: Diagnosis not present

## 2015-11-26 DIAGNOSIS — M9903 Segmental and somatic dysfunction of lumbar region: Secondary | ICD-10-CM | POA: Diagnosis not present

## 2015-11-28 DIAGNOSIS — M545 Low back pain: Secondary | ICD-10-CM | POA: Diagnosis not present

## 2015-11-28 DIAGNOSIS — M546 Pain in thoracic spine: Secondary | ICD-10-CM | POA: Diagnosis not present

## 2015-11-28 DIAGNOSIS — M5489 Other dorsalgia: Secondary | ICD-10-CM | POA: Diagnosis not present

## 2015-11-29 DIAGNOSIS — M4726 Other spondylosis with radiculopathy, lumbar region: Secondary | ICD-10-CM | POA: Diagnosis not present

## 2015-11-29 DIAGNOSIS — M5117 Intervertebral disc disorders with radiculopathy, lumbosacral region: Secondary | ICD-10-CM | POA: Diagnosis not present

## 2015-11-29 DIAGNOSIS — M9903 Segmental and somatic dysfunction of lumbar region: Secondary | ICD-10-CM | POA: Diagnosis not present

## 2015-11-29 DIAGNOSIS — M9905 Segmental and somatic dysfunction of pelvic region: Secondary | ICD-10-CM | POA: Diagnosis not present

## 2015-11-29 DIAGNOSIS — M62838 Other muscle spasm: Secondary | ICD-10-CM | POA: Diagnosis not present

## 2015-11-29 DIAGNOSIS — M9904 Segmental and somatic dysfunction of sacral region: Secondary | ICD-10-CM | POA: Diagnosis not present

## 2015-11-30 DIAGNOSIS — M545 Low back pain: Secondary | ICD-10-CM | POA: Diagnosis not present

## 2015-11-30 DIAGNOSIS — M5489 Other dorsalgia: Secondary | ICD-10-CM | POA: Diagnosis not present

## 2015-11-30 DIAGNOSIS — M546 Pain in thoracic spine: Secondary | ICD-10-CM | POA: Diagnosis not present

## 2015-12-03 DIAGNOSIS — M9904 Segmental and somatic dysfunction of sacral region: Secondary | ICD-10-CM | POA: Diagnosis not present

## 2015-12-03 DIAGNOSIS — M546 Pain in thoracic spine: Secondary | ICD-10-CM | POA: Diagnosis not present

## 2015-12-03 DIAGNOSIS — M5117 Intervertebral disc disorders with radiculopathy, lumbosacral region: Secondary | ICD-10-CM | POA: Diagnosis not present

## 2015-12-03 DIAGNOSIS — M9903 Segmental and somatic dysfunction of lumbar region: Secondary | ICD-10-CM | POA: Diagnosis not present

## 2015-12-03 DIAGNOSIS — M545 Low back pain: Secondary | ICD-10-CM | POA: Diagnosis not present

## 2015-12-03 DIAGNOSIS — M5489 Other dorsalgia: Secondary | ICD-10-CM | POA: Diagnosis not present

## 2015-12-03 DIAGNOSIS — M9905 Segmental and somatic dysfunction of pelvic region: Secondary | ICD-10-CM | POA: Diagnosis not present

## 2015-12-05 DIAGNOSIS — M5489 Other dorsalgia: Secondary | ICD-10-CM | POA: Diagnosis not present

## 2015-12-05 DIAGNOSIS — M546 Pain in thoracic spine: Secondary | ICD-10-CM | POA: Diagnosis not present

## 2015-12-05 DIAGNOSIS — M545 Low back pain: Secondary | ICD-10-CM | POA: Diagnosis not present

## 2015-12-05 MED FILL — ESTRADIOL 0.1 MG PATCH: 0.1 | 84 days supply | Qty: 24 | Fill #0

## 2015-12-06 DIAGNOSIS — M5117 Intervertebral disc disorders with radiculopathy, lumbosacral region: Secondary | ICD-10-CM | POA: Diagnosis not present

## 2015-12-06 DIAGNOSIS — M9905 Segmental and somatic dysfunction of pelvic region: Secondary | ICD-10-CM | POA: Diagnosis not present

## 2015-12-06 DIAGNOSIS — M9903 Segmental and somatic dysfunction of lumbar region: Secondary | ICD-10-CM | POA: Diagnosis not present

## 2015-12-06 DIAGNOSIS — M9904 Segmental and somatic dysfunction of sacral region: Secondary | ICD-10-CM | POA: Diagnosis not present

## 2015-12-07 DIAGNOSIS — M545 Low back pain: Secondary | ICD-10-CM | POA: Diagnosis not present

## 2015-12-07 DIAGNOSIS — M546 Pain in thoracic spine: Secondary | ICD-10-CM | POA: Diagnosis not present

## 2015-12-07 DIAGNOSIS — M5489 Other dorsalgia: Secondary | ICD-10-CM | POA: Diagnosis not present

## 2015-12-12 DIAGNOSIS — M5412 Radiculopathy, cervical region: Secondary | ICD-10-CM | POA: Diagnosis not present

## 2015-12-12 DIAGNOSIS — M5416 Radiculopathy, lumbar region: Secondary | ICD-10-CM | POA: Diagnosis not present

## 2015-12-13 DIAGNOSIS — M9905 Segmental and somatic dysfunction of pelvic region: Secondary | ICD-10-CM | POA: Diagnosis not present

## 2015-12-13 DIAGNOSIS — M5117 Intervertebral disc disorders with radiculopathy, lumbosacral region: Secondary | ICD-10-CM | POA: Diagnosis not present

## 2015-12-13 DIAGNOSIS — M9904 Segmental and somatic dysfunction of sacral region: Secondary | ICD-10-CM | POA: Diagnosis not present

## 2015-12-13 DIAGNOSIS — M9903 Segmental and somatic dysfunction of lumbar region: Secondary | ICD-10-CM | POA: Diagnosis not present

## 2015-12-15 DIAGNOSIS — M549 Dorsalgia, unspecified: Secondary | ICD-10-CM | POA: Diagnosis not present

## 2015-12-15 DIAGNOSIS — R300 Dysuria: Secondary | ICD-10-CM | POA: Diagnosis not present

## 2015-12-17 DIAGNOSIS — M4726 Other spondylosis with radiculopathy, lumbar region: Secondary | ICD-10-CM | POA: Diagnosis not present

## 2015-12-19 HISTORY — PX: LAMINECTOMY: SHX219

## 2015-12-21 DIAGNOSIS — M5416 Radiculopathy, lumbar region: Secondary | ICD-10-CM | POA: Diagnosis not present

## 2015-12-26 DIAGNOSIS — Z0000 Encounter for general adult medical examination without abnormal findings: Secondary | ICD-10-CM | POA: Diagnosis not present

## 2015-12-26 DIAGNOSIS — K589 Irritable bowel syndrome without diarrhea: Secondary | ICD-10-CM | POA: Diagnosis not present

## 2015-12-26 DIAGNOSIS — M5416 Radiculopathy, lumbar region: Secondary | ICD-10-CM | POA: Diagnosis not present

## 2015-12-26 DIAGNOSIS — M1990 Unspecified osteoarthritis, unspecified site: Secondary | ICD-10-CM | POA: Diagnosis not present

## 2015-12-26 DIAGNOSIS — M5412 Radiculopathy, cervical region: Secondary | ICD-10-CM | POA: Diagnosis not present

## 2015-12-26 DIAGNOSIS — M545 Low back pain: Secondary | ICD-10-CM | POA: Diagnosis not present

## 2015-12-26 DIAGNOSIS — M25512 Pain in left shoulder: Secondary | ICD-10-CM | POA: Diagnosis not present

## 2015-12-26 DIAGNOSIS — M419 Scoliosis, unspecified: Secondary | ICD-10-CM | POA: Diagnosis not present

## 2016-01-14 DIAGNOSIS — M4806 Spinal stenosis, lumbar region: Secondary | ICD-10-CM | POA: Diagnosis not present

## 2016-01-14 DIAGNOSIS — M961 Postlaminectomy syndrome, not elsewhere classified: Secondary | ICD-10-CM | POA: Diagnosis not present

## 2016-01-14 DIAGNOSIS — G968 Other specified disorders of central nervous system: Secondary | ICD-10-CM | POA: Diagnosis not present

## 2016-01-14 DIAGNOSIS — M5416 Radiculopathy, lumbar region: Secondary | ICD-10-CM | POA: Diagnosis not present

## 2016-02-01 DIAGNOSIS — R7303 Prediabetes: Secondary | ICD-10-CM | POA: Diagnosis not present

## 2016-02-01 DIAGNOSIS — M5136 Other intervertebral disc degeneration, lumbar region: Secondary | ICD-10-CM | POA: Diagnosis not present

## 2016-02-01 DIAGNOSIS — I429 Cardiomyopathy, unspecified: Secondary | ICD-10-CM | POA: Diagnosis not present

## 2016-02-01 DIAGNOSIS — R42 Dizziness and giddiness: Secondary | ICD-10-CM | POA: Diagnosis not present

## 2016-02-15 DIAGNOSIS — M545 Low back pain: Secondary | ICD-10-CM | POA: Diagnosis not present

## 2016-02-15 DIAGNOSIS — M4806 Spinal stenosis, lumbar region: Secondary | ICD-10-CM | POA: Diagnosis not present

## 2016-02-18 DIAGNOSIS — M4806 Spinal stenosis, lumbar region: Secondary | ICD-10-CM | POA: Diagnosis not present

## 2016-02-18 DIAGNOSIS — M545 Low back pain: Secondary | ICD-10-CM | POA: Diagnosis not present

## 2016-02-19 DIAGNOSIS — R42 Dizziness and giddiness: Secondary | ICD-10-CM | POA: Diagnosis not present

## 2016-02-19 DIAGNOSIS — I341 Nonrheumatic mitral (valve) prolapse: Secondary | ICD-10-CM | POA: Diagnosis not present

## 2016-02-21 DIAGNOSIS — M545 Low back pain: Secondary | ICD-10-CM | POA: Diagnosis not present

## 2016-02-21 DIAGNOSIS — M4806 Spinal stenosis, lumbar region: Secondary | ICD-10-CM | POA: Diagnosis not present

## 2016-02-25 DIAGNOSIS — M545 Low back pain: Secondary | ICD-10-CM | POA: Diagnosis not present

## 2016-02-25 DIAGNOSIS — M4806 Spinal stenosis, lumbar region: Secondary | ICD-10-CM | POA: Diagnosis not present

## 2016-02-25 MED FILL — ESTRADIOL 0.1 MG PATCH: 0.1 | 84 days supply | Qty: 24 | Fill #1 | Status: TO

## 2016-03-04 DIAGNOSIS — M545 Low back pain: Secondary | ICD-10-CM | POA: Diagnosis not present

## 2016-03-04 DIAGNOSIS — M4806 Spinal stenosis, lumbar region: Secondary | ICD-10-CM | POA: Diagnosis not present

## 2016-03-06 DIAGNOSIS — I340 Nonrheumatic mitral (valve) insufficiency: Secondary | ICD-10-CM | POA: Diagnosis not present

## 2016-03-06 DIAGNOSIS — I371 Nonrheumatic pulmonary valve insufficiency: Secondary | ICD-10-CM | POA: Diagnosis not present

## 2016-03-07 DIAGNOSIS — M545 Low back pain: Secondary | ICD-10-CM | POA: Diagnosis not present

## 2016-03-07 DIAGNOSIS — M4806 Spinal stenosis, lumbar region: Secondary | ICD-10-CM | POA: Diagnosis not present

## 2016-03-11 DIAGNOSIS — M545 Low back pain: Secondary | ICD-10-CM | POA: Diagnosis not present

## 2016-03-11 DIAGNOSIS — M4806 Spinal stenosis, lumbar region: Secondary | ICD-10-CM | POA: Diagnosis not present

## 2016-03-14 DIAGNOSIS — M4806 Spinal stenosis, lumbar region: Secondary | ICD-10-CM | POA: Diagnosis not present

## 2016-03-14 DIAGNOSIS — M545 Low back pain: Secondary | ICD-10-CM | POA: Diagnosis not present

## 2016-03-18 DIAGNOSIS — M4806 Spinal stenosis, lumbar region: Secondary | ICD-10-CM | POA: Diagnosis not present

## 2016-03-18 DIAGNOSIS — M545 Low back pain: Secondary | ICD-10-CM | POA: Diagnosis not present

## 2016-03-20 DIAGNOSIS — M4806 Spinal stenosis, lumbar region: Secondary | ICD-10-CM | POA: Diagnosis not present

## 2016-03-20 DIAGNOSIS — M545 Low back pain: Secondary | ICD-10-CM | POA: Diagnosis not present

## 2016-03-25 DIAGNOSIS — M4806 Spinal stenosis, lumbar region: Secondary | ICD-10-CM | POA: Diagnosis not present

## 2016-03-25 DIAGNOSIS — M545 Low back pain: Secondary | ICD-10-CM | POA: Diagnosis not present

## 2016-03-27 DIAGNOSIS — M545 Low back pain: Secondary | ICD-10-CM | POA: Diagnosis not present

## 2016-03-27 DIAGNOSIS — M4806 Spinal stenosis, lumbar region: Secondary | ICD-10-CM | POA: Diagnosis not present

## 2016-05-05 DIAGNOSIS — M25561 Pain in right knee: Secondary | ICD-10-CM | POA: Diagnosis not present

## 2016-05-05 DIAGNOSIS — H5711 Ocular pain, right eye: Secondary | ICD-10-CM | POA: Diagnosis not present

## 2016-05-05 DIAGNOSIS — H00011 Hordeolum externum right upper eyelid: Secondary | ICD-10-CM | POA: Diagnosis not present

## 2018-07-14 ENCOUNTER — Encounter: Payer: Self-pay | Admitting: Gastroenterology

## 2018-08-30 ENCOUNTER — Encounter: Payer: Self-pay | Admitting: Gastroenterology

## 2018-08-30 ENCOUNTER — Ambulatory Visit (INDEPENDENT_AMBULATORY_CARE_PROVIDER_SITE_OTHER): Payer: BLUE CROSS/BLUE SHIELD | Admitting: Gastroenterology

## 2018-08-30 VITALS — BP 102/70 | HR 76 | Ht 63.0 in | Wt 131.1 lb

## 2018-08-30 DIAGNOSIS — R1084 Generalized abdominal pain: Secondary | ICD-10-CM | POA: Diagnosis not present

## 2018-08-30 DIAGNOSIS — K581 Irritable bowel syndrome with constipation: Secondary | ICD-10-CM | POA: Diagnosis not present

## 2018-08-30 DIAGNOSIS — R140 Abdominal distension (gaseous): Secondary | ICD-10-CM | POA: Diagnosis not present

## 2018-08-30 NOTE — Progress Notes (Signed)
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  She was diagnosed with IBS years ago.  She states over the past 6 months her symptoms have been more active.  She relates gas, bloating, generalized abdominal pain, flatus and belching.  She takes stool softeners and MiraLAX frequently. CBC, CMP in 06/2018 were normal. Denies weight loss, diarrhea, change in stool caliber, melena, hematochezia, nausea, vomiting, dysphagia, reflux symptoms, chest pain.   Colonoscopy August 2013: normal  Allergies  Allergen Reactions  . Augmentin [Amoxicillin-Pot Clavulanate] Diarrhea and Nausea And Vomiting  . Codeine Diarrhea and Nausea And Vomiting  . Erythromycin Diarrhea and Nausea And Vomiting   Outpatient Medications Prior to Visit  Medication Sig Dispense Refill  . baclofen (LIORESAL) 10 MG tablet Take 10 mg by mouth 2 (two) times daily.    . Calcium Carbonate-Vitamin D (CALCIUM 600+D) 600-200 MG-UNIT TABS Take 1 tablet by mouth 2 (two) times daily.     Marland Kitchen dicyclomine (BENTYL) 10 MG capsule Take 10 mg by mouth as needed for spasms.    Marland Kitchen docusate sodium (STOOL SOFTENER) 100 MG capsule Take 100 mg by mouth as needed for mild constipation.    Marland Kitchen estradiol (VIVELLE-DOT) 0.1 MG/24HR Place 1 patch onto the skin 2 (two) times a week.    . gabapentin (NEURONTIN) 100 MG capsule Take 100 mg by mouth 3 (three) times daily.    . Misc Natural Products (OSTEO BI-FLEX ADV JOINT SHIELD) TABS Take 1 tablet by mouth daily.    . Multiple Vitamin (MULTIVITAMIN) tablet Take 1 tablet by mouth daily.    . Omega-3 Fatty Acids (FISH OIL) 1000 MG CAPS Take 1 capsule by mouth daily.    Marland Kitchen Peppermint Oil (IBGARD PO) Take by mouth every 12 (twelve) hours.    . polyethylene glycol powder (GLYCOLAX/MIRALAX) powder Take 17 g by mouth daily. (Patient taking differently: Take 17 g by mouth every other day. ) 255  g 0  . Probiotic Product (PROBIOTIC FORMULA PO) Take 10,000 capsules by mouth daily.    . hyoscyamine (LEVSIN, ANASPAZ) 0.125 MG tablet Take 1-2 every 4 hours as needed 60 tablet 11   No facility-administered medications prior to visit.    Past Medical History:  Diagnosis Date  . Anxiety   . Cervical cancer ([place]) 003  . IBS (irritable bowel syndrome)   . Migraines    Past Surgical History:  Procedure Laterality Date  . ABDOMINAL HYSTERECTOMY    . CESAREAN SECTION    . LAMINECTOMY  12/2015   L4, L5   Social History   Socioeconomic History  . Marital status: Married    Spouse name: Not on file  . Number of children: 1  . Years of education: Not on file  . Highest education level: Not on file  Occupational History  . Occupation: Programmer, multimedia: [employer]  . Financial resource strain: Not on file  . Food insecurity:    Worry: Not on file    Inability: Not on file  . Transportation needs:    Medical: Not on file    Non-medical: Not on file  Tobacco Use  . Smoking status: Never Smoker  . Smokeless tobacco: Never Used  Substance and Sexual Activity  . Alcohol use: No  . Drug use: No  . Sexual activity: Not on file  Lifestyle  . Physical activity:  Days per week: Not on file    Minutes per session: Not on file  . Stress: Not on file  Relationships  . Social connections:    Talks on phone: Not on file    Gets together: Not on file    Attends religious service: Not on file    Active member of club or organization: Not on file    Attends meetings of clubs or organizations: Not on file    Relationship status: Not on file  Other Topics Concern  . Not on file  Social History Narrative  . Not on file   Family History  Problem Relation Age of Onset  . Prostate cancer Father   . Colon polyps Father   . Diabetes Maternal Grandfather   . Breast cancer Paternal Grandmother   . Colon cancer Paternal Grandmother   . Heart disease Paternal Grandfather    . Hypertension Mother   . Rectal cancer Neg Hx   . Stomach cancer Neg Hx       Review of Systems: Pertinent positive and negative review of systems were noted in the above HPI section. All other review of systems were otherwise negative.   Physical Exam: General: Well developed, well nourished, no acute distress Head: Normocephalic and atraumatic Eyes:  sclerae anicteric, EOMI Ears: Normal auditory acuity Mouth: No deformity or lesions Neck: Supple, no masses or thyromegaly Lungs: Clear throughout to auscultation Heart: Regular rate and rhythm; no murmurs, rubs or bruits Abdomen: Soft, non tender and non distended. No masses, hepatosplenomegaly or hernias noted. Normal Bowel sounds Rectal: Not done Musculoskeletal: Symmetrical with no gross deformities  Skin: No lesions on visible extremities Pulses:  Normal pulses noted Extremities: No clubbing, cyanosis, edema or deformities noted Neurological: Alert oriented x 4, grossly nonfocal Cervical Nodes:  No significant cervical adenopathy Inguinal Nodes: No significant inguinal adenopathy Psychological:  Alert and cooperative. Normal mood and affect   Assessment and Recommendations:  1.  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  Rule out other intra-abdominal process.  Schedule abdominal/pelvic CT.  Increase IBgard to 1-2 TID and use dicyclomine 10 mg po TID prn but more frequently for breakthrough symptoms.  Gas-X 3 times daily prn.  Titrate dose of MiraLAX and stool softeners for 1-2 complete bowel movements per day.  REV in 6 weeks.   cc: Arsenio Loader, [place] [address]

## 2018-08-30 NOTE — Patient Instructions (Signed)
Take your bentyl three times a day more regularly.   Take over the counter Gas-X three times a day for gas and bloating.   Increase your IB Gard to 1-2 capsules three times a day.   You have been scheduled for a CT scan of the abdomen and pelvis at [facility] ([address]---this is in the same building as [facility]).   You are scheduled on 09/14/18 at 3:00pm. You should arrive 15 minutes prior to your appointment time for registration. Please follow the written instructions below on the day of your exam:  WARNING: IF YOU ARE ALLERGIC TO IODINE/X-RAY DYE, PLEASE NOTIFY RADIOLOGY IMMEDIATELY AT [phone]! YOU WILL BE GIVEN A 13 HOUR PREMEDICATION PREP.  1) Do not eat or drink anything after 1:00pm (4 hours prior to your test) 2) You have been given 2 bottles of oral contrast to drink. The solution may taste better if refrigerated, but do NOT add ice or any other liquid to this solution. Shake well before drinking.    Drink 1 bottle of contrast @ 1:00pm (2 hours prior to your exam)  Drink 1 bottle of contrast @ 2:00pm (1 hour prior to your exam)  You may take any medications as prescribed with a small amount of water, if necessary. If you take any of the following medications: METFORMIN, GLUCOPHAGE, GLUCOVANCE, AVANDAMET, RIOMET, FORTAMET, [place] MET, JANUMET, GLUMETZA or METAGLIP, you MAY be asked to HOLD this medication 48 hours AFTER the exam.  The purpose of you drinking the oral contrast is to aid in the visualization of your intestinal tract. The contrast solution may cause some diarrhea. Depending on your individual set of symptoms, you may also receive an intravenous injection of x-ray contrast/dye. Plan on being at [facility] for 30 minutes or longer, depending on the type of exam you are having performed.  This test typically takes 30-45 minutes to complete.  If you have any questions regarding your exam or if you need to reschedule, you may call  the CT department at [phone] between the hours of 8:00 am and 5:00 pm, Monday-Friday.  ________________________________________________________________________  Normal BMI (Body Mass Index- based on height and weight) is between 19 and 25. Your BMI today is Body mass index is 23.23 kg/m. Marland Kitchen Please consider follow up  regarding your BMI with your Primary Care Provider.  Thank you for choosing me and [facility].  Pricilla Riffle. Dagoberto Ligas., MD., [facility]

## 2018-09-14 ENCOUNTER — Ambulatory Visit (INDEPENDENT_AMBULATORY_CARE_PROVIDER_SITE_OTHER)
Admission: RE | Admit: 2018-09-14 | Discharge: 2018-09-14 | Disposition: A | Discharge status: Home / Self Care | Payer: BLUE CROSS/BLUE SHIELD | Source: Ambulatory Visit | Attending: Gastroenterology | Admitting: Gastroenterology

## 2018-09-14 DIAGNOSIS — R1084 Generalized abdominal pain: Secondary | ICD-10-CM

## 2018-09-14 MED ORDER — IOPAMIDOL (ISOVUE-300) INJECTION 61%
100.0000 mL | Freq: Once | INTRAVENOUS | Status: AC | PRN
  Administered 2018-09-14: 100 mL via INTRAVENOUS

## 2019-04-11 IMAGING — CT CT ABD-PELV W/ CM
2 of 5 series · 16 of 46 positions shown, 18 images · IV contrast (ISOVUE 300)
Comparison: None.

CLINICAL DATA: Generalized abdominal pain, worsening constipation.

EXAM:
CT ABDOMEN AND PELVIS WITH CONTRAST
TECHNIQUE: Multidetector CT imaging of the abdomen and pelvis was performed
using the standard protocol following bolus administration of
intravenous contrast.
CONTRAST:  100mL 43T46Z-3MM IOPAMIDOL (43T46Z-3MM) INJECTION 61%

[Series 2: abd/pel w · axial · 0.59mm/px · z∈[-601,-236]mm · 13 of 83 slices shown, 15 images]
[im 5/83  soft-tissue]
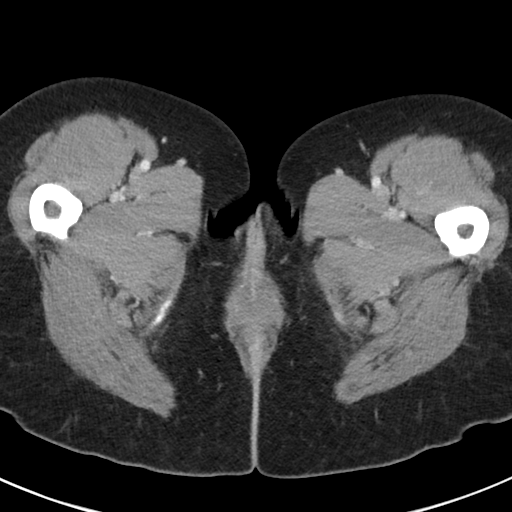
[im 5/83  bone]
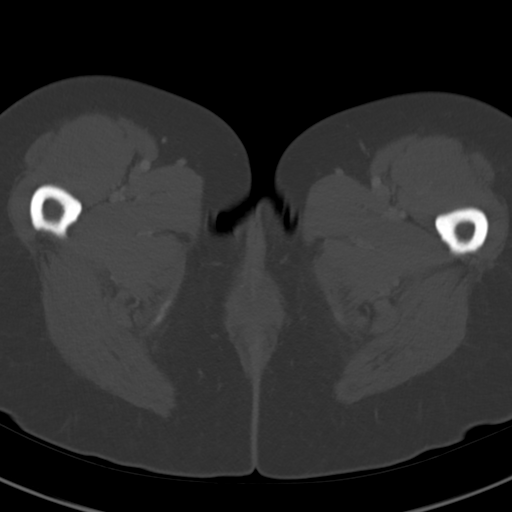
[im 13/83  soft-tissue]
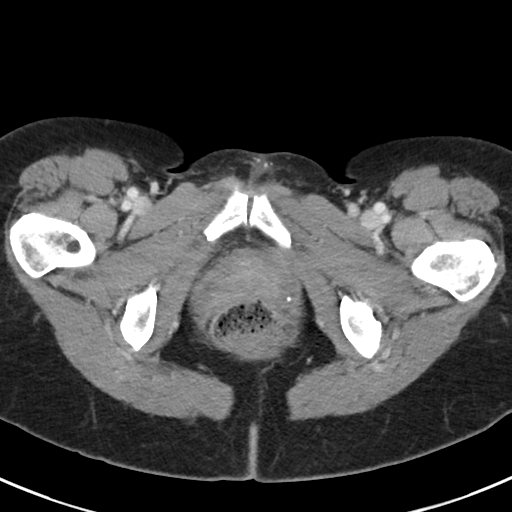
[im 17/83  soft-tissue]
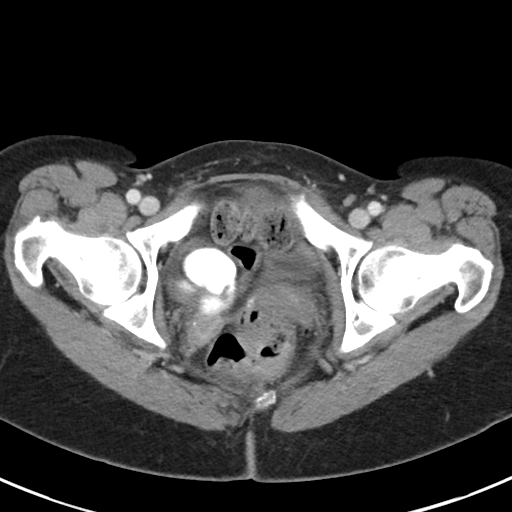
[im 25/83  soft-tissue]
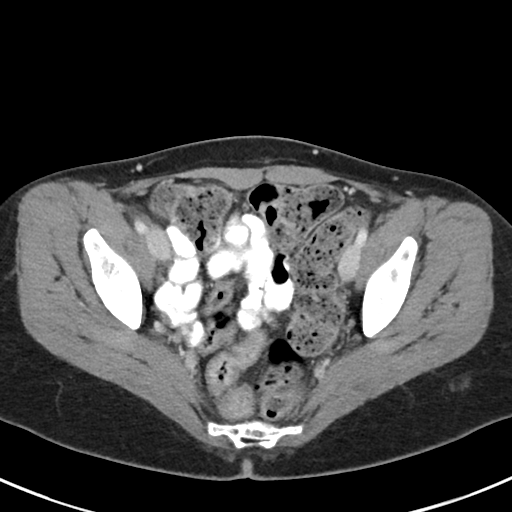
[im 29/83  soft-tissue]
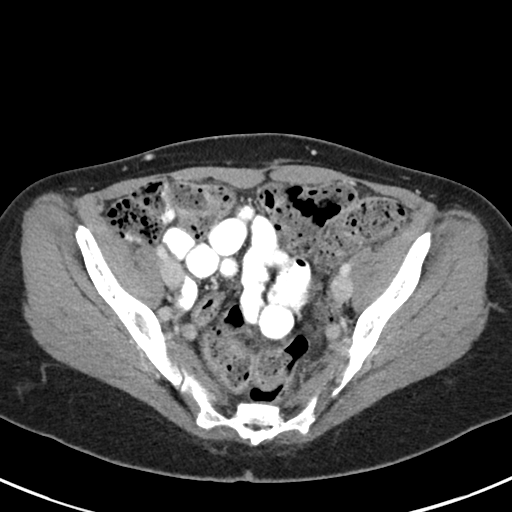
[im 37/83  soft-tissue]
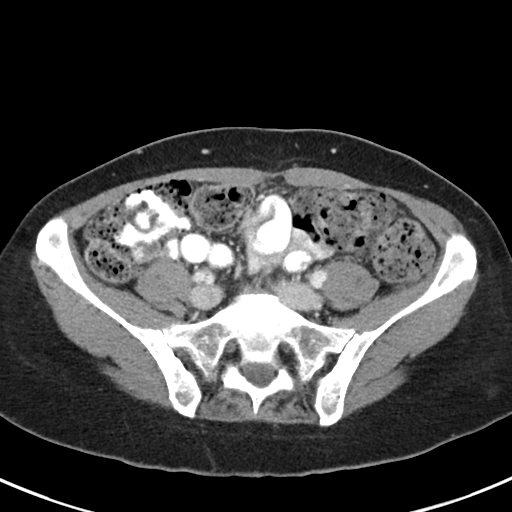
[im 42/83  soft-tissue]
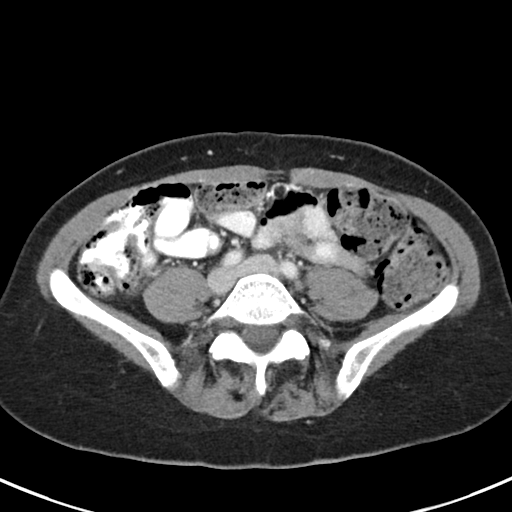
[im 46/83  soft-tissue]
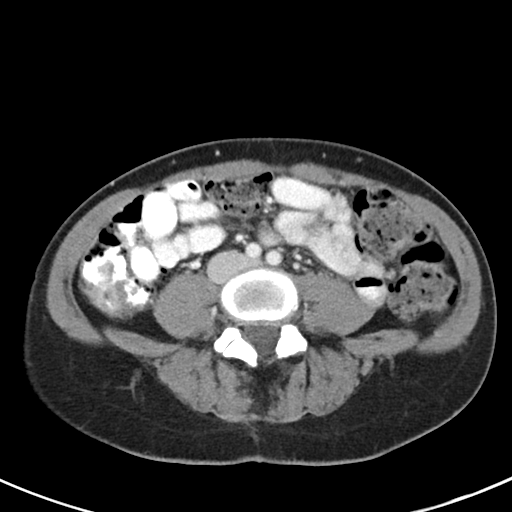
[im 54/83  soft-tissue]
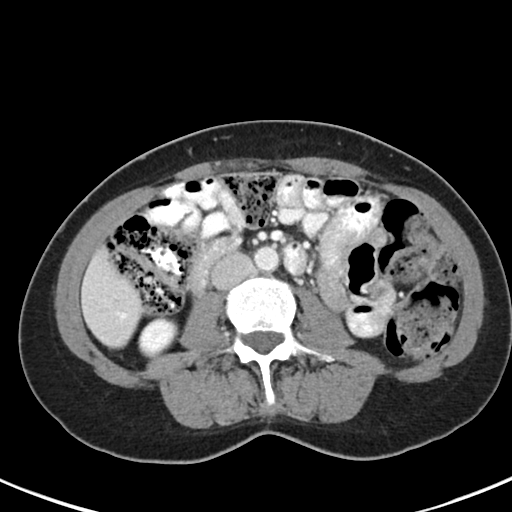
[im 54/83  bone]
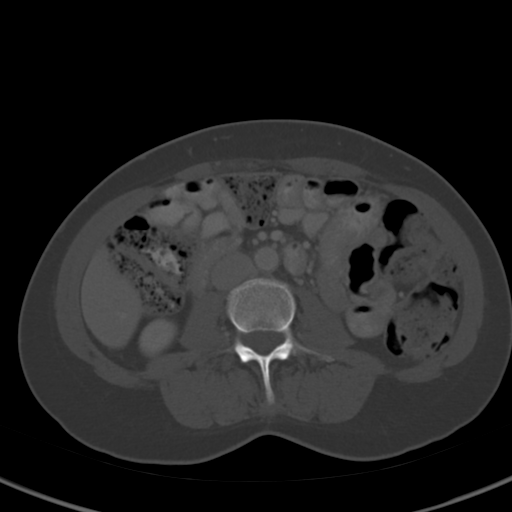
[im 58/83  soft-tissue]
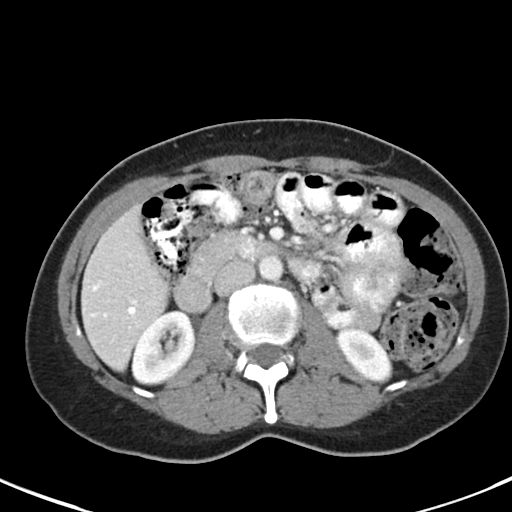
[im 66/83  soft-tissue]
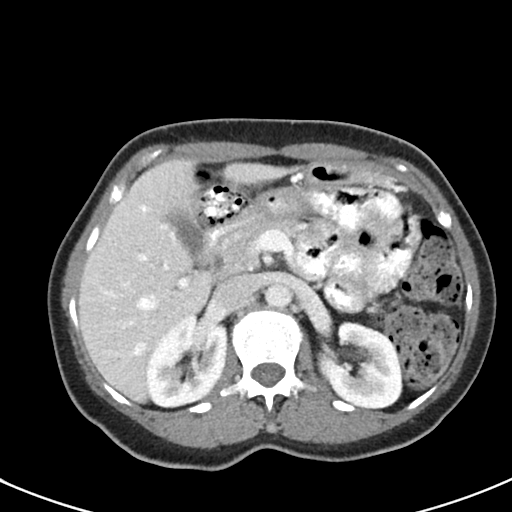
[im 70/83  soft-tissue]
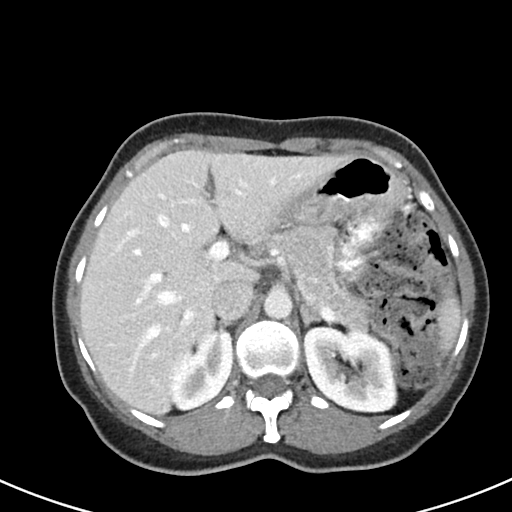
[im 78/83  soft-tissue]
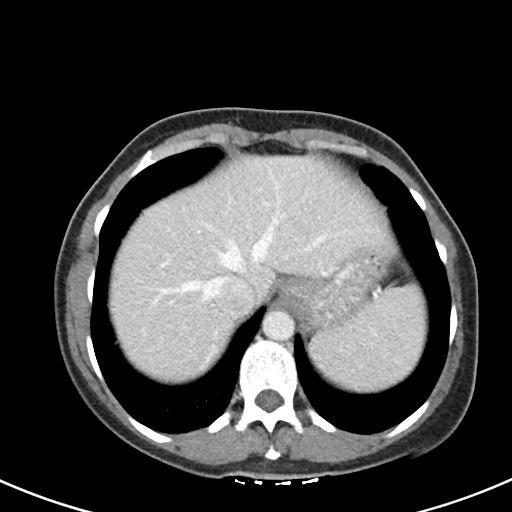

[Series 6: abd/pel w st · coronal · 0.56mm/px · 3 of 65 slices shown]
[im 22/65  soft-tissue]
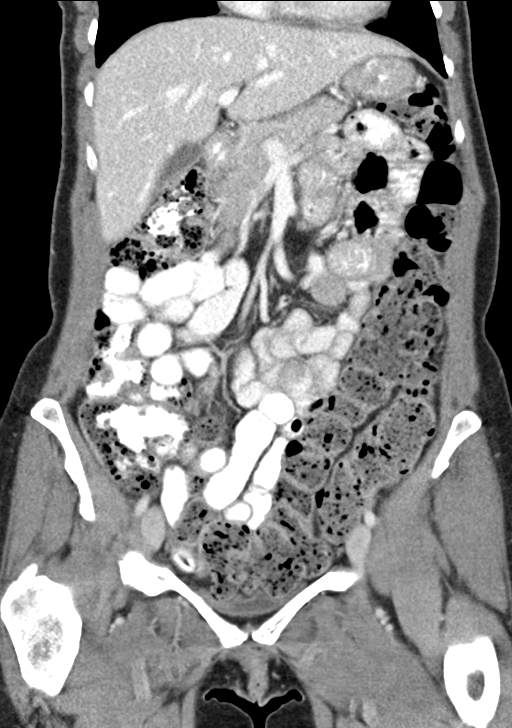
[im 29/65  soft-tissue]
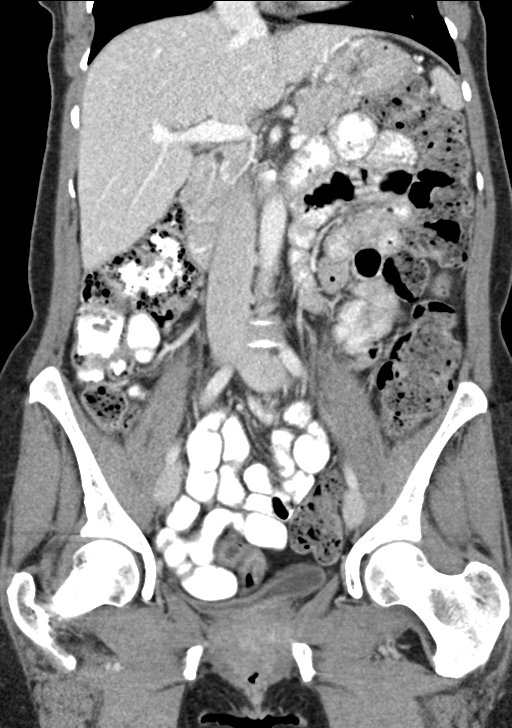
[im 36/65  soft-tissue]
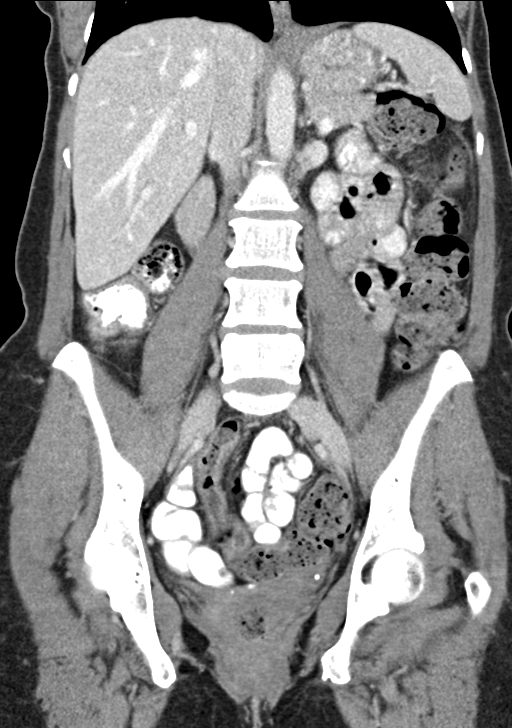

[16 of 46 positions shown; findings below may reference images not displayed]

FINDINGS: Lower chest: Lung bases show no acute findings. Heart size normal.
No pericardial or pleural effusion.

Hepatobiliary: Liver and gallbladder are unremarkable. No biliary
ductal dilatation.

Pancreas: Negative.

Spleen: Negative.

Adrenals/Urinary Tract: Adrenal glands and kidneys are unremarkable.
Ureters are decompressed. Bladder is low in volume.

Stomach/Bowel: Stomach, small bowel and appendix are unremarkable.
Stool is seen throughout the colon.

Vascular/Lymphatic: Vascular structures are unremarkable. No
pathologically enlarged lymph nodes.

Reproductive: Hysterectomy.  No adnexal mass.

Other: No free fluid.  Mesenteries and peritoneum are unremarkable.

Musculoskeletal: No worrisome lytic or sclerotic lesions.
IMPRESSION: Constipation. No additional findings to explain the patient's
abdominal pain.

## 2022-07-02 ENCOUNTER — Encounter: Payer: Self-pay | Admitting: Gastroenterology

## 2023-03-03 ENCOUNTER — Encounter: Payer: Self-pay | Admitting: Gastroenterology

## 2023-04-08 ENCOUNTER — Ambulatory Visit (AMBULATORY_SURGERY_CENTER): Payer: BLUE CROSS/BLUE SHIELD

## 2023-04-08 ENCOUNTER — Telehealth: Payer: Self-pay

## 2023-04-08 ENCOUNTER — Encounter: Payer: Self-pay | Admitting: Gastroenterology

## 2023-04-08 VITALS — Ht 63.0 in | Wt 130.0 lb

## 2023-04-08 DIAGNOSIS — Z1211 Encounter for screening for malignant neoplasm of colon: Secondary | ICD-10-CM

## 2023-04-08 MED ORDER — NA SULFATE-K SULFATE-MG SULF 17.5-3.13-1.6 GM/177ML PO SOLN: 1.0000 | Freq: Once | ORAL | 0 refills | Status: AC

## 2023-04-08 NOTE — Progress Notes (Signed)

## 2023-04-08 NOTE — Telephone Encounter (Deleted)
Patient is having basal cell carcinoma removed on left lower back, 11 days prior to colonoscopy on 05/11/23.  Patient stated, that it will be 4 inches above buttock area and covered. Please advise to proceed with colonoscopy or reschedule.

## 2023-04-08 NOTE — Telephone Encounter (Signed)
Fine to proceed with colonoscopy as scheduled. Please keep the lower back basal cell incision clean and well covered for the colonoscopy.

## 2023-04-08 NOTE — Telephone Encounter (Signed)
Spoke with patient and let her know that Dr. [name] ok'd to proceed with colonoscopy .  Patient was advise to keep area clean and covered for procedure.

## 2023-05-11 ENCOUNTER — Ambulatory Visit (AMBULATORY_SURGERY_CENTER): Payer: BLUE CROSS/BLUE SHIELD | Admitting: Gastroenterology

## 2023-05-11 ENCOUNTER — Encounter: Payer: Self-pay | Admitting: Gastroenterology

## 2023-05-11 VITALS — BP 119/64 | HR 76 | Temp 98.4°F | Resp 14 | Ht 63.0 in | Wt 130.0 lb

## 2023-05-11 DIAGNOSIS — D120 Benign neoplasm of cecum: Secondary | ICD-10-CM | POA: Diagnosis not present

## 2023-05-11 DIAGNOSIS — K635 Polyp of colon: Secondary | ICD-10-CM

## 2023-05-11 DIAGNOSIS — Z83719 Family history of colon polyps, unspecified: Secondary | ICD-10-CM

## 2023-05-11 DIAGNOSIS — D124 Benign neoplasm of descending colon: Secondary | ICD-10-CM | POA: Diagnosis not present

## 2023-05-11 DIAGNOSIS — Z1211 Encounter for screening for malignant neoplasm of colon: Secondary | ICD-10-CM

## 2023-05-11 MED ORDER — SODIUM CHLORIDE 0.9 % IV SOLN: 500.0000 mL | Freq: Once | INTRAVENOUS | Status: DC

## 2023-05-11 NOTE — Progress Notes (Signed)
Sedate, gd SR, tolerated procedure well, VSS, report to RN 

## 2023-05-11 NOTE — Patient Instructions (Signed)
YOU HAD AN ENDOSCOPIC PROCEDURE TODAY AT THE [facility]:   Refer to the procedure report that was given to you for any specific questions about what was found during the examination.  If the procedure report does not answer your questions, please call your gastroenterologist to clarify.  If you requested that your care partner not be given the details of your procedure findings, then the procedure report has been included in a sealed envelope for you to review at your convenience later.  **Handouts given on polyps and hemorrhoids**  YOU SHOULD EXPECT: Some feelings of bloating in the abdomen. Passage of more gas than usual.  Walking can help get rid of the air that was put into your GI tract during the procedure and reduce the bloating. If you had a lower endoscopy (such as a colonoscopy or flexible sigmoidoscopy) you may notice spotting of blood in your stool or on the toilet paper. If you underwent a bowel prep for your procedure, you may not have a normal bowel movement for a few days.  Please Note:  You might notice some irritation and congestion in your nose or some drainage.  This is from the oxygen used during your procedure.  There is no need for concern and it should clear up in a day or so.  SYMPTOMS TO REPORT IMMEDIATELY:  Following lower endoscopy (colonoscopy or flexible sigmoidoscopy):  Excessive amounts of blood in the stool  Significant tenderness or worsening of abdominal pains  Swelling of the abdomen that is new, acute  Fever of 100F or higher  For urgent or emergent issues, a gastroenterologist can be reached at any hour by calling [phone]. Do not use MyChart messaging for urgent concerns.    DIET:  We do recommend a small meal at first, but then you may proceed to your regular diet.  Drink plenty of fluids but you should avoid alcoholic beverages for 24 hours.  ACTIVITY:  You should plan to take it easy for the rest of today and you should NOT DRIVE or  use heavy machinery until tomorrow (because of the sedation medicines used during the test).    FOLLOW UP: Our staff will call the number listed on your records the next business day following your procedure.  We will call around 7:15- 8:00 am to check on you and address any questions or concerns that you may have regarding the information given to you following your procedure. If we do not reach you, we will leave a message.     If any biopsies were taken you will be contacted by phone or by letter within the next 1-3 weeks.  Please call us at [phone] if you have not heard about the biopsies in 3 weeks.    SIGNATURES/CONFIDENTIALITY: You and/or your care partner have signed paperwork which will be entered into your electronic medical record.  These signatures attest to the fact that that the information above on your After Visit Summary has been reviewed and is understood.  Full responsibility of the confidentiality of this discharge information lies with you and/or your care-partner.  

## 2023-05-11 NOTE — [facility] Notes (Signed)
Called to room to assist during endoscopic procedure.  Patient ID and intended procedure confirmed with present staff. Received instructions for my participation in the procedure from the performing physician.  

## 2023-05-11 NOTE — [facility] Notes (Signed)
Pt's states no medical or surgical changes since previsit or office visit. 

## 2023-05-11 NOTE — Op Note (Addendum)
[facility] Patient Name: Brittany Stewart Procedure Date: 05/11/2023 8:39 AM MRN: [id number] Endoscopist: Meryl Dare , MD, [phone] Age: 62 Referring MD:  Date of Birth: 1961-08-19 Gender: Female Account #: [id number] Procedure:                Colonoscopy Indications:              Colon cancer screening in patient at increased                            risk: Family history of colon polyps in multiple                            1st-degree relatives Medicines:                Monitored Anesthesia Care Procedure:                Pre-Anesthesia Assessment:                           - Prior to the procedure, a History and Physical                            was performed, and patient medications and                            allergies were reviewed. The patient's tolerance of                            previous anesthesia was also reviewed. The risks                            and benefits of the procedure and the sedation                            options and risks were discussed with the patient.                            All questions were answered, and informed consent                            was obtained. Prior Anticoagulants: The patient has                            taken no anticoagulant or antiplatelet agents. ASA                            Grade Assessment: II - A patient with mild systemic                            disease. After reviewing the risks and benefits,                            the patient was deemed in satisfactory condition to  undergo the procedure.                           After obtaining informed consent, the colonoscope                            was passed under direct vision. Throughout the                            procedure, the patient's blood pressure, pulse, and                            oxygen saturations were monitored continuously. The                            CF [id number] #[id number] was introduced  through the anus                            and advanced to the the cecum, identified by                            appendiceal orifice and ileocecal valve. The                            ileocecal valve, appendiceal orifice, and rectum                            were photographed. The quality of the bowel                            preparation was good after substantial lavage,                            suction. The colonoscopy was somewhat difficult due                            to a redundant colon, significant looping and a                            tortuous colon. The patient tolerated the procedure                            well. Scope In: 8:41:54 AM Scope Out: 9:06:07 AM Scope Withdrawal Time: 0 hours 15 minutes 21 seconds  Total Procedure Duration: 0 hours 24 minutes 13 seconds  Findings:                 The perianal and digital rectal examinations were                            normal.                           Three sessile polyps were found in the transverse  colon (2) and cecum (1). The polyps were 5 to 9 mm                            in size. These polyps were removed with a cold                            snare. Resection and retrieval were complete.                           Internal hemorrhoids were found during                            retroflexion. The hemorrhoids were small and Grade                            I (internal hemorrhoids that do not prolapse).                           The exam was otherwise without abnormality on                            direct and retroflexion views. Complications:            No immediate complications. Estimated blood loss:                            None. Estimated Blood Loss:     Estimated blood loss: none. Impression:               - Three 5 to 9 mm polyps in the transverse colon                            and in the cecum, removed with a cold snare.                            Resected and  retrieved.                           - Internal hemorrhoids.                           - The examination was otherwise normal on direct                            and retroflexion views. Recommendation:           - Repeat colonoscopy, likely 5 years, after studies                            are complete for surveillance based on pathology                            results.                           - Patient has a contact number  available for                            emergencies. The signs and symptoms of potential                            delayed complications were discussed with the                            patient. Return to normal activities tomorrow.                            Written discharge instructions were provided to the                            patient.                           - Resume previous diet.                           - Continue present medications.                           - Await pathology results. Meryl Dare, MD 05/11/2023 9:10:31 AM This report has been signed electronically.

## 2023-05-11 NOTE — [facility] Notes (Signed)
History & Physical  Primary Care Physician:  Bynum Bellows, MD Primary Gastroenterologist: Claudette Head, MD  Impression / Plan:  Average risk CRC screening for colonoscopy.   CHIEF COMPLAINT:  CRC screening  HPI: Brittany Stewart is a 62 y.o. female average risk CRC screening for colonoscopy.    Past Medical History:  Diagnosis Date   Anxiety    Cervical cancer (HCC) 003   Depression    Heart murmur    IBS (irritable bowel syndrome)    Migraines     Past Surgical History:  Procedure Laterality Date   ABDOMINAL HYSTERECTOMY     CESAREAN SECTION     HAND TENDON SURGERY Right 1998   LAMINECTOMY  12/2015   L4, L5    Prior to Admission medications   Medication Sig Start Date End Date Taking? Authorizing Provider  CALCIUM MAGNESIUM ZINC PO Take by mouth.   Yes [provider]  estradiol (VIVELLE-DOT) 0.1 MG/24HR Place 1 patch onto the skin 2 (two) times a week.   Yes [provider]  Misc Natural Products (OSTEO BI-FLEX ADV JOINT SHIELD) TABS Take 1 tablet by mouth daily.   Yes [provider]  Multiple Vitamin (MULTIVITAMIN) tablet Take 1 tablet by mouth daily.   Yes [provider]  Omega-3 Fatty Acids (FISH OIL) 1000 MG CAPS Take 1 capsule by mouth daily.   Yes [provider]  Peppermint Oil (IBGARD PO) Take by mouth every 12 (twelve) hours.   Yes [provider]  Vitamins-Lipotropics (LIPOFLAVONOID PO) Take by mouth.   Yes [provider]  meloxicam (MOBIC) 15 MG tablet Take 15 mg by mouth daily. 03/08/23   [provider]  polyethylene glycol powder (GLYCOLAX/MIRALAX) powder Take 17 g by mouth daily. Patient taking differently: Take 17 g by mouth every other day. 03/18/12   Meryl Dare, MD  Probiotic Product (PROBIOTIC FORMULA PO) Take 10,000 capsules by mouth daily.    [provider]    Current Outpatient Medications  Medication Sig Dispense Refill   CALCIUM MAGNESIUM ZINC  PO Take by mouth.     estradiol (VIVELLE-DOT) 0.1 MG/24HR Place 1 patch onto the skin 2 (two) times a week.     Misc Natural Products (OSTEO BI-FLEX ADV JOINT SHIELD) TABS Take 1 tablet by mouth daily.     Multiple Vitamin (MULTIVITAMIN) tablet Take 1 tablet by mouth daily.     Omega-3 Fatty Acids (FISH OIL) 1000 MG CAPS Take 1 capsule by mouth daily.     Peppermint Oil (IBGARD PO) Take by mouth every 12 (twelve) hours.     Vitamins-Lipotropics (LIPOFLAVONOID PO) Take by mouth.     meloxicam (MOBIC) 15 MG tablet Take 15 mg by mouth daily.     polyethylene glycol powder (GLYCOLAX/MIRALAX) powder Take 17 g by mouth daily. (Patient taking differently: Take 17 g by mouth every other day.) 255 g 0   Probiotic Product (PROBIOTIC FORMULA PO) Take 10,000 capsules by mouth daily.     Current Facility-Administered Medications  Medication Dose Route Frequency Provider Last Rate Last Admin   0.9 %  sodium chloride infusion  500 mL Intravenous Once Meryl Dare, MD        Allergies as of 05/11/2023 - Review Complete 05/11/2023  Allergen Reaction Noted   Augmentin [amoxicillin-pot clavulanate] Diarrhea and Nausea And Vomiting 03/16/2012   Codeine Diarrhea and Nausea And Vomiting 03/16/2012   Erythromycin Diarrhea and Nausea And Vomiting 03/16/2012    Family History  Problem Relation Age of Onset   Hypertension Mother    Prostate cancer Father    Colon polyps Father    Colon polyps Brother    Diabetes Maternal Grandfather    Breast cancer Paternal Grandmother    Colon cancer Paternal Grandmother    Heart disease Paternal Grandfather    Rectal cancer Neg Hx    Stomach cancer Neg Hx    Esophageal cancer Neg Hx     Social History   Socioeconomic History   Marital status: Married    Spouse name: Not on file   Number of children: 1   Years of education: Not on file   Highest education level: Not on file  Occupational History   Occupation: Teacher, adult education: [employer]  Tobacco Use    Smoking status: Never   Smokeless tobacco: Never  Vaping Use   Vaping status: Never Used  Substance and Sexual Activity   Alcohol use: No   Drug use: No   Sexual activity: Not on file  Other Topics Concern   Not on file  Social History Narrative   Not on file   Social Determinants of Health   Financial Resource Strain: Not on file  Food Insecurity: Not on file  Transportation Needs: Not on file  Physical Activity: Not on file  Stress: Not on file  Social Connections: Not on file  Intimate Partner Violence: Not on file    Review of Systems:  All systems reviewed were negative except where noted in HPI.   Physical Exam:  General:  Alert, well-developed, in NAD Head:  Normocephalic and atraumatic. Eyes:  Sclera clear, no icterus.   Conjunctiva pink. Ears:  Normal auditory acuity. Mouth:  No deformity or lesions.  Neck:  Supple; no masses. Lungs:  Clear throughout to auscultation.   No wheezes, crackles, or rhonchi.  Heart:  Regular rate and rhythm; no murmurs. Abdomen:  Soft, nondistended, nontender. No masses, hepatomegaly. No palpable masses.  Normal bowel sounds.    Rectal:  Deferred   Msk:  Symmetrical without gross deformities. Extremities:  Without edema. Neurologic:  Alert and  oriented x 4; grossly normal neurologically. Skin:  Intact without significant lesions or rashes. Psych:  Alert and cooperative. Normal mood and affect.   [name]. [name]  05/11/2023, 8:34 AM See [name], [facility], to contact our on call provider

## 2023-05-12 ENCOUNTER — Telehealth: Payer: Self-pay | Admitting: *Deleted

## 2023-05-12 NOTE — Telephone Encounter (Signed)
  Follow up Call-     05/11/2023    7:48 AM  Call back number  Post procedure Call Back phone  # [phone]  Permission to leave phone message Yes     Patient questions:  Do you have a fever, pain , or abdominal swelling? No. Pain Score  0 *  Have you tolerated food without any problems? Yes.    Have you been able to return to your normal activities? Yes.    Do you have any questions about your discharge instructions: Diet   No. Medications  No. Follow up visit  No.  Do you have questions or concerns about your Care? Yes.    Actions: * If pain score is 4 or above: No action needed, pain <4.

## 2023-05-12 NOTE — Telephone Encounter (Signed)
Phoned patient back and ensured her that 3 polyps were removed as noted in the procedure report. She appreciated the call back and verbalized understanding.

## 2023-05-21 ENCOUNTER — Encounter: Payer: Self-pay | Admitting: Gastroenterology
